# Patient Record
Sex: Male | Born: 1975 | Race: White | Hispanic: No | Marital: Single | Smoking: Never smoker
Health system: Southern US, Community
[De-identification: ages and names within clinical notes are randomized; demographics above are authoritative.]

## PROBLEM LIST (undated history)

## (undated) HISTORY — PX: DENTAL SURGERY: SHX609

---

## 2008-10-27 ENCOUNTER — Ambulatory Visit: Payer: Self-pay | Admitting: Internal Medicine

## 2016-07-09 ENCOUNTER — Encounter: Payer: Self-pay | Admitting: Emergency Medicine

## 2016-07-09 ENCOUNTER — Ambulatory Visit
Admission: EM | Admit: 2016-07-09 | Discharge: 2016-07-09 | Disposition: A | Discharge status: Home / Self Care | Payer: Self-pay | Attending: Family Medicine | Admitting: Family Medicine

## 2016-07-09 ENCOUNTER — Ambulatory Visit (INDEPENDENT_AMBULATORY_CARE_PROVIDER_SITE_OTHER): Payer: Self-pay

## 2016-07-09 DIAGNOSIS — M5412 Radiculopathy, cervical region: Secondary | ICD-10-CM

## 2016-07-09 MED ORDER — NAPROXEN 500 MG PO TABS: 500.0000 mg | ORAL_TABLET | Freq: Two times a day (BID) | ORAL | Status: AC

## 2016-07-09 NOTE — ED Provider Notes (Signed)
CSN: [id number]     Arrival date & time 07/09/16  [id number] History   First MD Initiated Contact with Patient 07/09/16 1001     Chief Complaint  Patient presents with  . Shoulder Pain  . Tingling   (Consider location/radiation/quality/duration/timing/severity/associated sxs/prior Treatment) HPI: Patient presents today with symptoms of tingling of left side of the palm extending into the fourth and fifth digits. He denies any trauma or injury to the shoulder elbow or wrist/hand area. He states that he has had the symptoms for about a month. He does notice some left upper trap tightness extending into the left shoulder. He denies any chest pain or shortness of breath. He has noticed the symptoms sometimes increasing when he has his elbow compressed against something for a while. He admits to having bone spurs in his lower back. He denies any known disc bulging or herniation. He denies any weakness of the left arm.  History reviewed. No pertinent past medical history. Past Surgical History  Procedure Laterality Date  . Dental surgery      several teeth pulled   History reviewed. No pertinent family history. Social History  Substance Use Topics  . Smoking status: Never Smoker   . Smokeless tobacco: None  . Alcohol Use: No    Review of Systems: Negative except mentioned above.   Allergies  Amoxicillin and Penicillins  Home Medications   Prior to Admission medications   Medication Sig Start Date End Date Taking? Authorizing Provider  ibuprofen (ADVIL,MOTRIN) 200 MG tablet Take 400 mg by mouth every 6 (six) hours as needed.   Yes Historical Provider, MD   Meds Ordered and Administered this Visit  Medications - No data to display  BP 139/76 mmHg  Pulse 73  Temp(Src) 98 F (36.7 C) (Oral)  Resp 18  SpO2 100% No data found.   Physical Exam:  GENERAL: NAD RESP: CTA B CARD: RRR MSK: increased neck girth, no midline cervical spine tenderness, mild tenderness of left upper trap.  with palpation, FROM of neck, arm, wrist, -Spurlings, 5/5 strength of upper extremities, nv intact  NEURO: CN II-XII grossly intact   ED Course  Procedures (including critical care time)  Labs Review Labs Reviewed - No data to display  Imaging Review No results found.     MDM  A/P: Cervical radiculopathy- discussed the results of the cervical spine x-ray, would recommend that patient follow-up with primary care physician for further evaluation and treatment, he may need imaging such as MRI or EMG/NCS. I advised the patient to sleep on an appropriate sized pillows and in a bed. I have also encouraged him not to compress the elbow on surfaces for long periods of time. NSAIDs when necessary. If symptoms worsen seek medical attention probably.    Jolene ProvostKirtida Vastie Douty, MD 07/09/16 [phone]

## 2016-07-09 NOTE — ED Notes (Signed)
Pt reports 4th and 5th finger have tingling "pins and needles" and in palm of Left hand. Past couple of weeks also noticed left shoulder tightness and neck and shoulder pain. Denies injury.

## 2017-06-15 IMAGING — CR DG CERVICAL SPINE COMPLETE 4+V
7 of 8 series · 9 of 10 positions shown · non-contrast
Comparison: No prior .

CLINICAL DATA: Pain. Numbness. No known injury. Initial evaluation.

EXAM:
CERVICAL SPINE - COMPLETE 4+ VIEW

[c-spine lat]
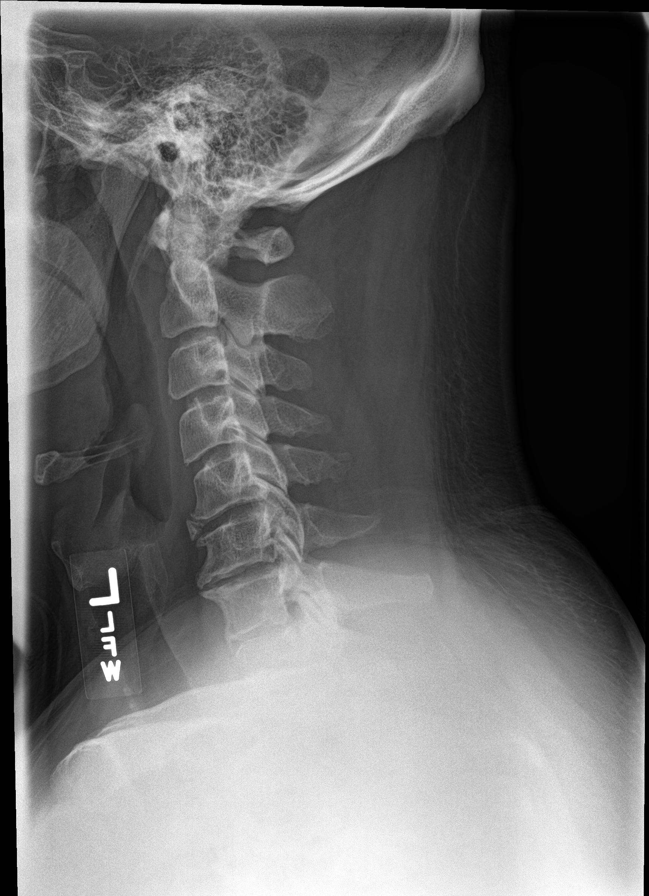

[Series 2: c-spine obl · 0.14mm/px · 2 of 2 slices shown (1 of 2)]
[im 1/2]
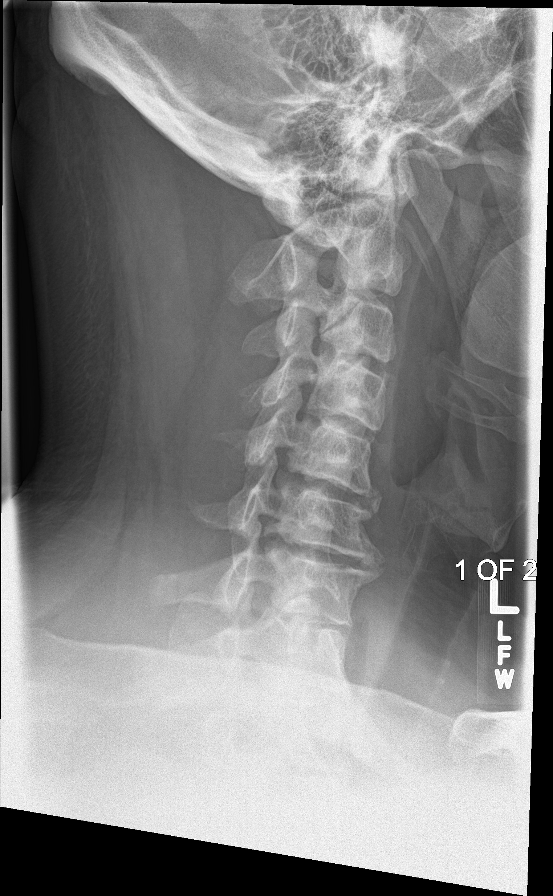
[im 2/2]
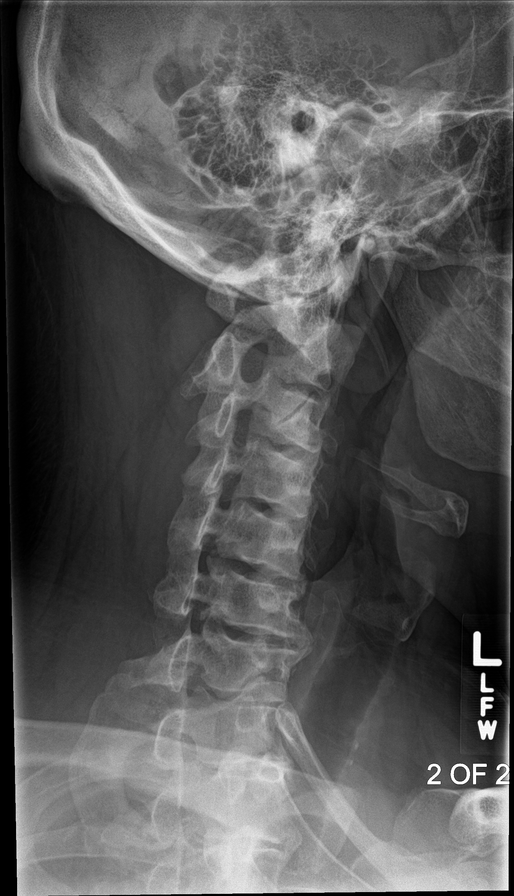

[Series 3: c-spine obl · 0.14mm/px · 2 of 2 slices shown (2 of 2)]
[im 1/2]
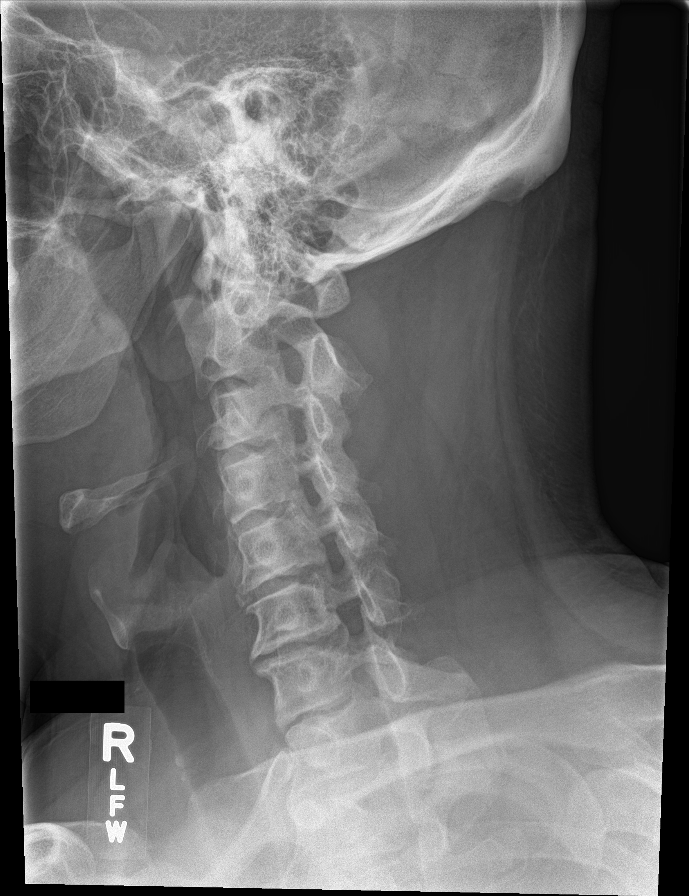
[im 2/2]
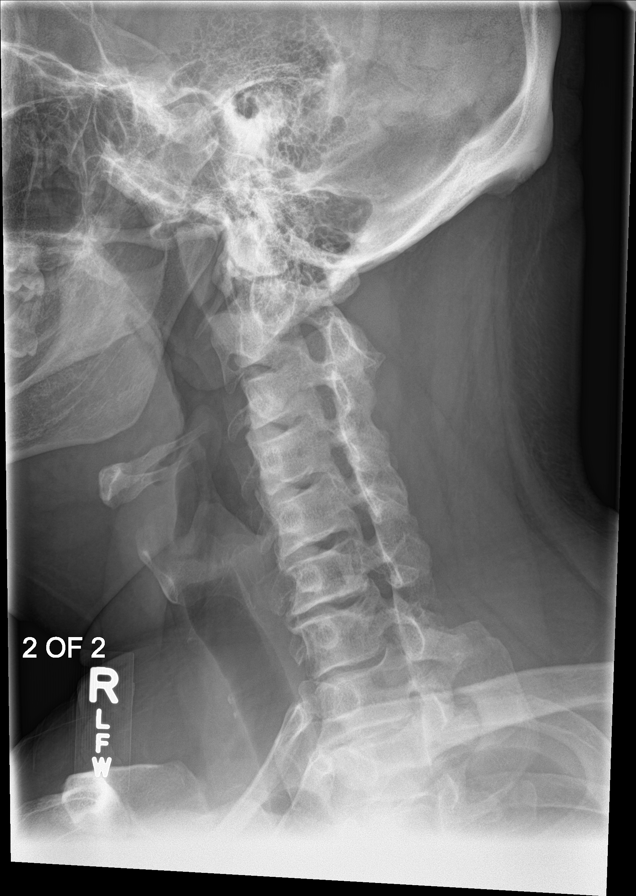

[c-spine ap (1 of 2)]
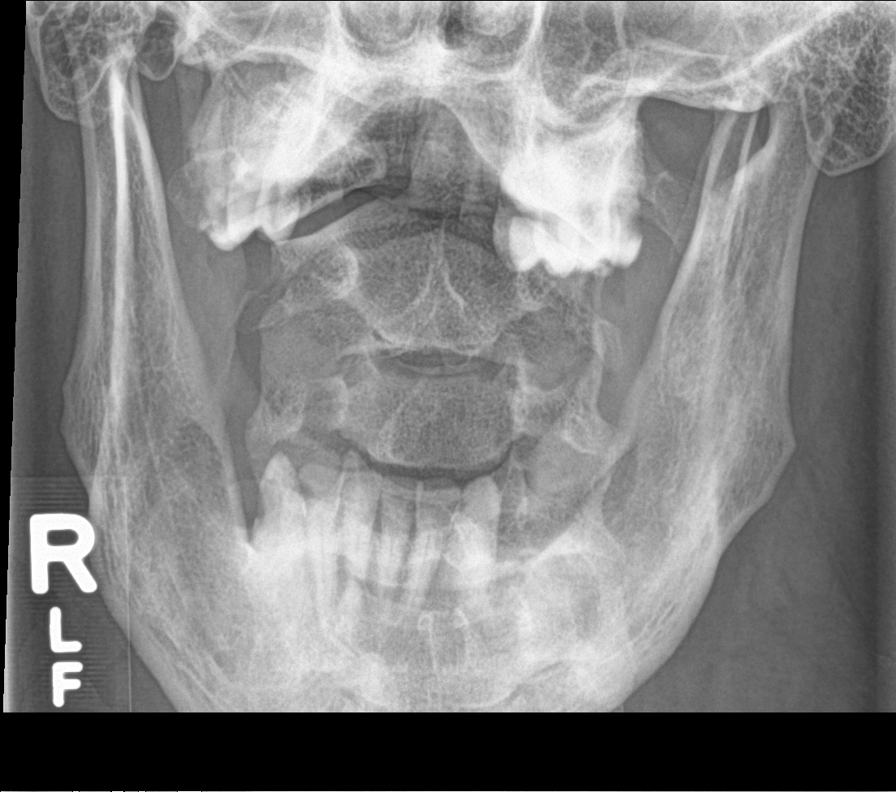

[c-spine open mouth]
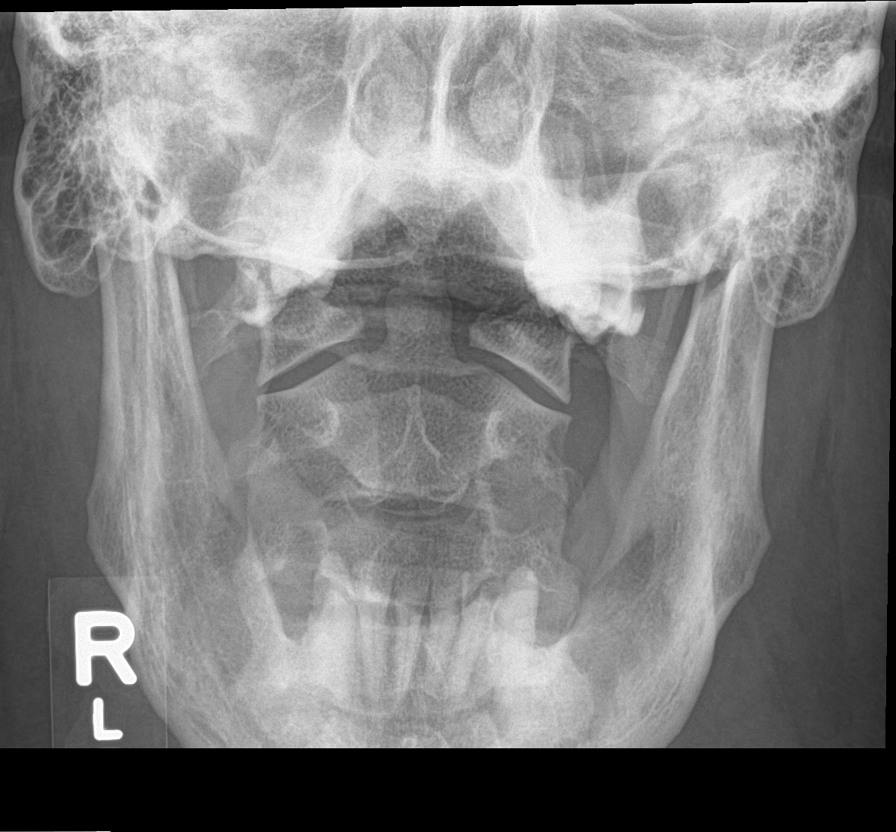

[ct-spine swimmers]
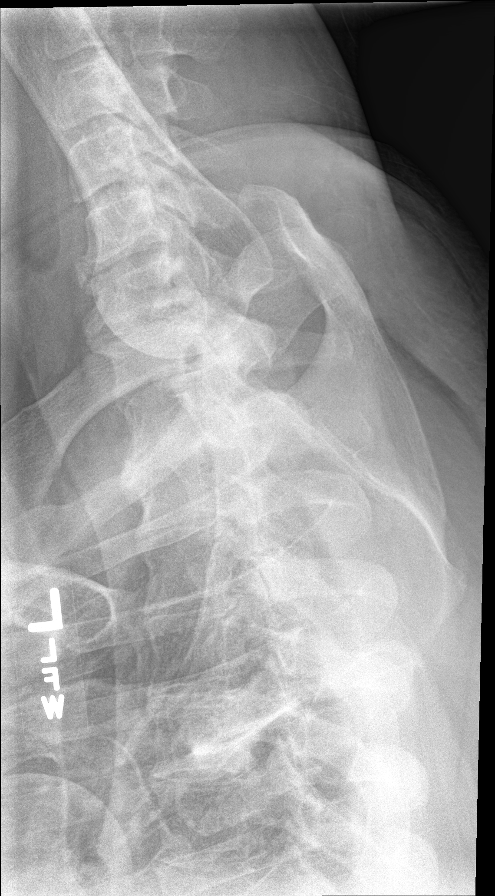

[c-spine ap (2 of 2)]
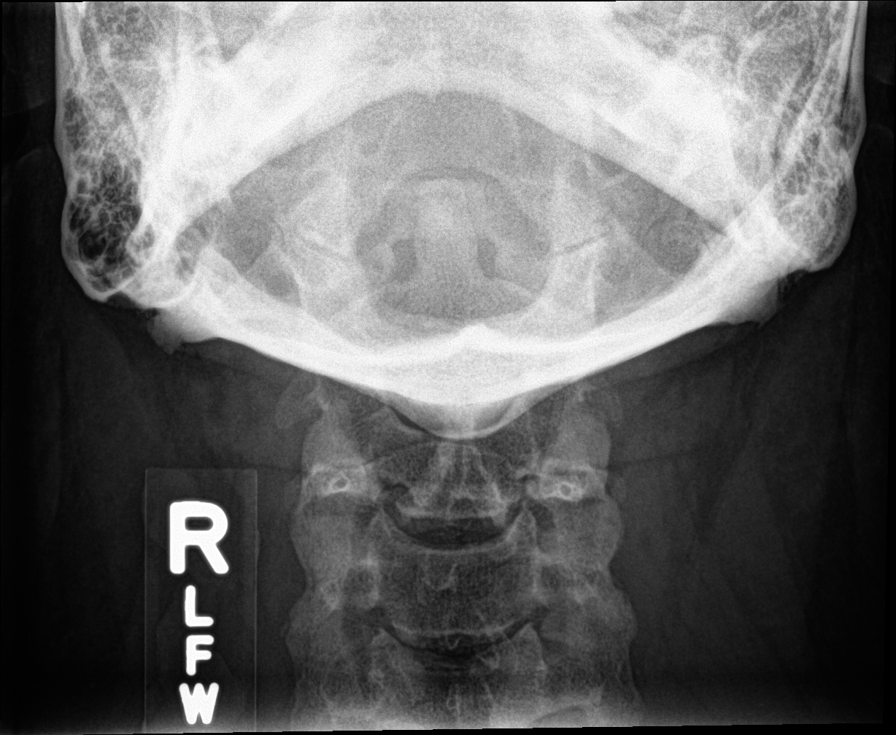

[9 of 10 positions shown; findings below may reference images not displayed]

FINDINGS: Diffuse multilevel degenerative change with mild straightening
cervical spine. Disc space loss is most prominent C5-C6-C6-C7.
Multilevel moderate bilateral neural foraminal narrowing is present.
No acute bony abnormality identified. Apical pleural thickening
noted most likely related to scarring.
IMPRESSION: 1. Diffuse multilevel degenerative change with mild straightening
cervical spine. Disc space loss is most prominent C5-C6 is C6-C7.
Multilevel bilateral neural foraminal narrowing is present.

2.  No acute abnormality identified.

## 2017-08-20 ENCOUNTER — Encounter: Payer: Self-pay | Admitting: Emergency Medicine

## 2017-08-20 ENCOUNTER — Emergency Department
Admission: EM | Admit: 2017-08-20 | Discharge: 2017-08-20 | Disposition: A | Discharge status: Other Acute Inpatient Hospital | Payer: Self-pay | Attending: Emergency Medicine | Admitting: Emergency Medicine

## 2017-08-20 DIAGNOSIS — T2122XA Burn of second degree of abdominal wall, initial encounter: Secondary | ICD-10-CM | POA: Insufficient documentation

## 2017-08-20 DIAGNOSIS — T23201A Burn of second degree of right hand, unspecified site, initial encounter: Secondary | ICD-10-CM | POA: Insufficient documentation

## 2017-08-20 DIAGNOSIS — T24211A Burn of second degree of right thigh, initial encounter: Secondary | ICD-10-CM | POA: Insufficient documentation

## 2017-08-20 DIAGNOSIS — T300 Burn of unspecified body region, unspecified degree: Secondary | ICD-10-CM

## 2017-08-20 DIAGNOSIS — X088XXA Exposure to other specified smoke, fire and flames, initial encounter: Secondary | ICD-10-CM | POA: Insufficient documentation

## 2017-08-20 DIAGNOSIS — Y929 Unspecified place or not applicable: Secondary | ICD-10-CM | POA: Insufficient documentation

## 2017-08-20 DIAGNOSIS — T23002A Burn of unspecified degree of left hand, unspecified site, initial encounter: Secondary | ICD-10-CM | POA: Insufficient documentation

## 2017-08-20 DIAGNOSIS — T24212A Burn of second degree of left thigh, initial encounter: Secondary | ICD-10-CM | POA: Insufficient documentation

## 2017-08-20 DIAGNOSIS — Y9389 Activity, other specified: Secondary | ICD-10-CM | POA: Insufficient documentation

## 2017-08-20 DIAGNOSIS — T23202A Burn of second degree of left hand, unspecified site, initial encounter: Secondary | ICD-10-CM | POA: Insufficient documentation

## 2017-08-20 DIAGNOSIS — Y998 Other external cause status: Secondary | ICD-10-CM | POA: Insufficient documentation

## 2017-08-20 LAB — CBC
HCT: 45.2 % (ref 40.0–52.0)
Hemoglobin: 16 g/dL (ref 13.0–18.0)
MCH: 30.8 pg (ref 26.0–34.0)
MCHC: 35.3 g/dL (ref 32.0–36.0)
MCV: 87.3 fL (ref 80.0–100.0)
PLATELETS: 237 10*3/uL (ref 150–440)
RBC: 5.18 MIL/uL (ref 4.40–5.90)
RDW: 13.3 % (ref 11.5–14.5)
WBC: 12.7 10*3/uL — AB (ref 3.8–10.6)

## 2017-08-20 LAB — COMPREHENSIVE METABOLIC PANEL
ALT: 60 U/L (ref 17–63)
AST: 48 U/L — AB (ref 15–41)
Albumin: 4 g/dL (ref 3.5–5.0)
Alkaline Phosphatase: 65 U/L (ref 38–126)
Anion gap: 10 (ref 5–15)
BUN: 14 mg/dL (ref 6–20)
CHLORIDE: 102 mmol/L (ref 101–111)
CO2: 24 mmol/L (ref 22–32)
CREATININE: 1.22 mg/dL (ref 0.61–1.24)
Calcium: 8.8 mg/dL — ABNORMAL LOW (ref 8.9–10.3)
GFR calc non Af Amer: >60 mL/min (ref >60)
Glucose, Bld: 345 mg/dL — ABNORMAL HIGH (ref 65–99)
POTASSIUM: 3.2 mmol/L — AB (ref 3.5–5.1)
SODIUM: 136 mmol/L (ref 135–145)
Total Bilirubin: 0.6 mg/dL (ref 0.3–1.2)
Total Protein: 7.8 g/dL (ref 6.5–8.1)

## 2017-08-20 LAB — PROTIME-INR
INR: 1.1
Prothrombin Time: 14.1 seconds (ref 11.4–15.2)

## 2017-08-20 MED ORDER — ONDANSETRON HCL 4 MG/2ML IJ SOLN
4.0000 mg | Freq: Once | INTRAMUSCULAR | Status: AC
  Administered 2017-08-20: 4 mg via INTRAVENOUS

## 2017-08-20 MED ORDER — SODIUM CHLORIDE 0.9 % IV BOLUS (SEPSIS)
1000.0000 mL | Freq: Once | INTRAVENOUS | Status: AC
  Administered 2017-08-20: 1000 mL via INTRAVENOUS

## 2017-08-20 MED ORDER — ONDANSETRON HCL 4 MG/2ML IJ SOLN
INTRAMUSCULAR | Status: AC
  Administered 2017-08-20: 4 mg via INTRAVENOUS
  Filled 2017-08-20: qty 2

## 2017-08-20 MED ORDER — DEXTROSE 5 % IV SOLN
1.0000 g | Freq: Once | INTRAVENOUS | Status: AC
  Administered 2017-08-20: 1 g via INTRAVENOUS
  Filled 2017-08-20: qty 10

## 2017-08-20 MED ORDER — ALUM & MAG HYDROXIDE-SIMETH 200-200-20 MG/5ML PO SUSP
30.0000 mL | Freq: Once | ORAL | Status: AC
  Administered 2017-08-20: 30 mL via ORAL

## 2017-08-20 MED ORDER — MORPHINE SULFATE (PF) 4 MG/ML IV SOLN
INTRAVENOUS | Status: AC
  Administered 2017-08-20: 4 mg via INTRAVENOUS
  Filled 2017-08-20: qty 1

## 2017-08-20 MED ORDER — MORPHINE SULFATE (PF) 4 MG/ML IV SOLN
4.0000 mg | Freq: Once | INTRAVENOUS | Status: AC
  Administered 2017-08-20: 4 mg via INTRAVENOUS

## 2017-08-20 MED ORDER — HYDROMORPHONE HCL 1 MG/ML IJ SOLN
INTRAMUSCULAR | Status: AC
  Administered 2017-08-20: 1 mg via INTRAVENOUS
  Filled 2017-08-20: qty 1

## 2017-08-20 MED ORDER — SILVER SULFADIAZINE 1 % EX CREA
TOPICAL_CREAM | CUTANEOUS | Status: AC
  Filled 2017-08-20: qty 85

## 2017-08-20 MED ORDER — HYDROMORPHONE HCL 1 MG/ML IJ SOLN
1.0000 mg | INTRAMUSCULAR | Status: DC | PRN
  Administered 2017-08-20 (×2): 1 mg via INTRAVENOUS
  Filled 2017-08-20: qty 1

## 2017-08-20 MED ORDER — SILVER SULFADIAZINE 1 % EX CREA
TOPICAL_CREAM | Freq: Once | CUTANEOUS | Status: AC
  Administered 2017-08-20: 13:00:00 via TOPICAL
  Filled 2017-08-20: qty 85

## 2017-08-20 MED ORDER — SILVER SULFADIAZINE 1 % EX CREA
TOPICAL_CREAM | CUTANEOUS | Status: AC
  Administered 2017-08-20: 03:00:00
  Filled 2017-08-20: qty 170

## 2017-08-20 MED ORDER — SILVER SULFADIAZINE 1 % EX CREA
TOPICAL_CREAM | Freq: Once | CUTANEOUS | Status: AC
  Administered 2017-08-20: 02:00:00 via TOPICAL

## 2017-08-20 MED ORDER — HYDROMORPHONE HCL 1 MG/ML IJ SOLN
1.0000 mg | Freq: Once | INTRAMUSCULAR | Status: AC
  Administered 2017-08-20: 1 mg via INTRAVENOUS

## 2017-08-20 MED ORDER — ALUM & MAG HYDROXIDE-SIMETH 200-200-20 MG/5ML PO SUSP
ORAL | Status: AC
  Administered 2017-08-20: 30 mL via ORAL
  Filled 2017-08-20: qty 30

## 2017-08-20 MED ORDER — SILVER SULFADIAZINE 1 % EX CREA
TOPICAL_CREAM | Freq: Once | CUTANEOUS | Status: AC
  Administered 2017-08-20: 13:00:00 via TOPICAL

## 2017-08-20 MED ORDER — SILVER SULFADIAZINE 1 % EX CREA
TOPICAL_CREAM | Freq: Once | CUTANEOUS | Status: AC
  Administered 2017-08-20: 10:00:00 via TOPICAL
  Filled 2017-08-20: qty 85

## 2017-08-20 MED ORDER — HYDROMORPHONE HCL 1 MG/ML IJ SOLN
INTRAMUSCULAR | Status: AC
  Filled 2017-08-20: qty 1

## 2017-08-20 NOTE — ED Notes (Signed)
EMTALA reviewed by this RN.  

## 2017-08-20 NOTE — ED Notes (Signed)
[facility]. All belongings sent with patient.
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
## 2017-08-20 NOTE — Progress Notes (Signed)
Inpatient Diabetes Program Recommendations  AACE/ADA: New Consensus Statement on Inpatient Glycemic Control (2015)  Target Ranges:  Prepandial:   less than 140 mg/dL      Peak postprandial:   less than 180 mg/dL (1-2 hours)      Critically ill patients:  140 - 180 mg/dL    Review of Glycemic Control- lab glucose   Results for [name], Donald B (MRN [id number]) as of 08/20/2017 11:12  Ref. Range 08/20/2017 02:17  Glucose Latest Ref Range: 65 - 99 mg/dL 409345 (H)    Diabetes history: none noted Outpatient Diabetes medications: none noted Current orders for Inpatient glycemic control: none  Inpatient Diabetes Program Recommendations:  Blood sugar 345mg /dl - if appropriate consider CBG checks while patient is awaiting transfer.    Per ADA recommendations "consider performing an A1C on all patients with diabetes or hyperglycemia admitted to the hospital if not performed in the prior 3 months".   Will need to control blood sugars to prevent infection.  Susette RacerJulie Jaylean Buenaventura, RN, BA, MHA, CDE Diabetes Coordinator Inpatient Diabetes Program  [phone] (Team Pager) [phone] [facility] Office) 08/20/2017 11:14 AM

## 2017-08-20 NOTE — ED Notes (Signed)
Pt requested something to eat that did not require his hands. Boost given.
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
